# Patient Record
Sex: Female | Born: 2000 | Race: Black or African American | Hispanic: No | Marital: Single | Smoking: Never smoker
Health system: Southern US, Community
[De-identification: ages and names within clinical notes are randomized; demographics above are authoritative.]

---
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
## 2020-03-18 ENCOUNTER — Other Ambulatory Visit: Payer: Self-pay

## 2020-03-18 MED ORDER — METRONIDAZOLE 500 MG PO TABS: 500.0000 mg | ORAL_TABLET | Freq: Two times a day (BID) | ORAL | 0 refills | Status: DC

## 2020-03-18 NOTE — Progress Notes (Signed)
Pt c/o malodorous vaginal discharge. Flagyl sent per protocol

## 2020-04-25 ENCOUNTER — Other Ambulatory Visit: Payer: Self-pay

## 2020-04-25 ENCOUNTER — Ambulatory Visit: Payer: Medicaid Other

## 2020-04-25 ENCOUNTER — Other Ambulatory Visit (HOSPITAL_COMMUNITY)
Admission: RE | Admit: 2020-04-25 | Discharge: 2020-04-25 | Disposition: A | Discharge status: Home / Self Care | Payer: Medicaid Other | Source: Ambulatory Visit | Attending: Obstetrics | Admitting: Obstetrics

## 2020-04-25 DIAGNOSIS — N898 Other specified noninflammatory disorders of vagina: Secondary | ICD-10-CM

## 2020-04-25 DIAGNOSIS — Z113 Encounter for screening for infections with a predominantly sexual mode of transmission: Secondary | ICD-10-CM | POA: Diagnosis present

## 2020-04-25 NOTE — Progress Notes (Signed)
SUBJECTIVE:  20 y.o. female complains of white vaginal discharge for 1 week. Denies abnormal vaginal bleeding or significant pelvic pain or fever. No UTI symptoms. Denies history of known exposure to STD.   OBJECTIVE:  She appears well, afebrile. Urine dipstick: not done.  ASSESSMENT:  Vaginal Discharge  Vaginal Odor   PLAN:  GC, chlamydia, trichomonas, BVAG, CVAG probe sent to lab. Treatment: To be determined once lab results are reviewed by the provider  ROV prn if symptoms persist or worsen.

## 2020-04-26 LAB — CERVICOVAGINAL ANCILLARY ONLY
Bacterial Vaginitis (gardnerella): POSITIVE — AB
Candida Glabrata: NEGATIVE
Candida Vaginitis: NEGATIVE
Chlamydia: POSITIVE — AB
Comment: NEGATIVE
Comment: NEGATIVE
Comment: NEGATIVE
Comment: NEGATIVE
Comment: NEGATIVE
Comment: NORMAL
Neisseria Gonorrhea: NEGATIVE
Trichomonas: POSITIVE — AB

## 2020-04-26 LAB — HEPATITIS C ANTIBODY: Hep C Virus Ab: <0.1 s/co ratio (ref 0.0–0.9)

## 2020-04-26 LAB — HIV ANTIBODY (ROUTINE TESTING W REFLEX): HIV Screen 4th Generation wRfx: NONREACTIVE

## 2020-04-26 LAB — HEPATITIS B SURFACE ANTIGEN: Hepatitis B Surface Ag: NEGATIVE

## 2020-04-26 LAB — RPR: RPR Ser Ql: NONREACTIVE

## 2020-04-29 ENCOUNTER — Other Ambulatory Visit: Payer: Self-pay | Admitting: Family Medicine

## 2020-04-29 ENCOUNTER — Emergency Department
Admission: EM | Admit: 2020-04-29 | Discharge: 2020-04-29 | Disposition: A | Discharge status: Home / Self Care | Payer: Medicaid Other | Attending: Emergency Medicine | Admitting: Emergency Medicine

## 2020-04-29 ENCOUNTER — Other Ambulatory Visit: Payer: Self-pay

## 2020-04-29 ENCOUNTER — Emergency Department: Payer: Medicaid Other

## 2020-04-29 ENCOUNTER — Telehealth: Payer: Self-pay

## 2020-04-29 ENCOUNTER — Encounter: Payer: Self-pay | Admitting: Emergency Medicine

## 2020-04-29 DIAGNOSIS — S42022A Displaced fracture of shaft of left clavicle, initial encounter for closed fracture: Secondary | ICD-10-CM | POA: Diagnosis not present

## 2020-04-29 DIAGNOSIS — S161XXA Strain of muscle, fascia and tendon at neck level, initial encounter: Secondary | ICD-10-CM | POA: Insufficient documentation

## 2020-04-29 DIAGNOSIS — R52 Pain, unspecified: Secondary | ICD-10-CM

## 2020-04-29 DIAGNOSIS — A749 Chlamydial infection, unspecified: Secondary | ICD-10-CM

## 2020-04-29 DIAGNOSIS — S4992XA Unspecified injury of left shoulder and upper arm, initial encounter: Secondary | ICD-10-CM | POA: Diagnosis present

## 2020-04-29 DIAGNOSIS — A5901 Trichomonal vulvovaginitis: Secondary | ICD-10-CM

## 2020-04-29 DIAGNOSIS — Y92410 Unspecified street and highway as the place of occurrence of the external cause: Secondary | ICD-10-CM | POA: Insufficient documentation

## 2020-04-29 MED ORDER — METRONIDAZOLE 500 MG PO TABS: 500.0000 mg | ORAL_TABLET | Freq: Two times a day (BID) | ORAL | 0 refills | Status: DC

## 2020-04-29 MED ORDER — HYDROCODONE-ACETAMINOPHEN 5-325 MG PO TABS: 1.0000 | ORAL_TABLET | Freq: Four times a day (QID) | ORAL | 0 refills | Status: AC | PRN

## 2020-04-29 MED ORDER — NAPROXEN 500 MG PO TABS: 500.0000 mg | ORAL_TABLET | Freq: Two times a day (BID) | ORAL | 0 refills | Status: DC

## 2020-04-29 MED ORDER — HYDROCODONE-ACETAMINOPHEN 5-325 MG PO TABS
1.0000 | ORAL_TABLET | Freq: Once | ORAL | Status: AC
Start: 2020-04-29 — End: 2020-04-29
  Administered 2020-04-29: 1 via ORAL
  Filled 2020-04-29: qty 1

## 2020-04-29 MED ORDER — AZITHROMYCIN 500 MG PO TABS: 1000.0000 mg | ORAL_TABLET | Freq: Once | ORAL | 1 refills | Status: AC
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
## 2020-04-29 NOTE — Telephone Encounter (Signed)
Pt called and given results. Pt advised to finish all medication and abstain from intercourse for at least 14 days after receiving treatment. Pt states she wants to call back to schedule a TOC. Pt has no further questions at this time
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
## 2020-04-29 NOTE — Telephone Encounter (Signed)
-----   Message from Reva Bores, MD sent at 04/29/2020  3:39 PM EDT ----- + for Chlam, trich--please notify anyone needed. Rx sent in and message sent to patient.

## 2020-05-13 ENCOUNTER — Other Ambulatory Visit: Payer: Self-pay

## 2020-05-13 DIAGNOSIS — N898 Other specified noninflammatory disorders of vagina: Secondary | ICD-10-CM

## 2020-05-13 MED ORDER — FLUCONAZOLE 150 MG PO TABS: 150.0000 mg | ORAL_TABLET | Freq: Once | ORAL | 0 refills | Status: AC

## 2020-05-13 NOTE — Progress Notes (Signed)
Pt c/o inner and outer vaginal itching. Recent ABx use for CT and Trich Diflucan sent to the pharmacy per protocol. Pt will contact the office for an appt if sx's worsens.

## 2021-05-15 ENCOUNTER — Ambulatory Visit: Payer: Medicaid Other

## 2021-05-15 ENCOUNTER — Other Ambulatory Visit (HOSPITAL_COMMUNITY)
Admission: RE | Admit: 2021-05-15 | Discharge: 2021-05-15 | Disposition: A | Discharge status: Home / Self Care | Payer: Medicaid Other | Source: Ambulatory Visit | Attending: Advanced Practice Midwife | Admitting: Advanced Practice Midwife

## 2021-05-15 VITALS — BP 117/83 | HR 80 | Wt 211.0 lb

## 2021-05-15 DIAGNOSIS — Z113 Encounter for screening for infections with a predominantly sexual mode of transmission: Secondary | ICD-10-CM

## 2021-05-15 DIAGNOSIS — N926 Irregular menstruation, unspecified: Secondary | ICD-10-CM | POA: Insufficient documentation

## 2021-05-15 DIAGNOSIS — N898 Other specified noninflammatory disorders of vagina: Secondary | ICD-10-CM

## 2021-05-15 NOTE — Progress Notes (Signed)
SUBJECTIVE:  ?21 y.o. female complains of white, creamy, and foul vaginal discharge and irregular periods for 1 month. ?Denies abnormal vaginal bleeding or significant pelvic pain or fever. No UTI symptoms. Denies history of known exposure to STD. ? ? ?OBJECTIVE:  ?She appears well, afebrile. ?Urine dipstick: not done. ? ?ASSESSMENT:  ?Vaginal Discharge  ?Vaginal Odor ? ? ?PLAN:  ?GC, chlamydia, trichomonas, BVAG, CVAG probe sent to lab. ?Treatment: To be determined once lab results are received ?ROV prn if symptoms persist or worsen.  ?

## 2021-05-19 LAB — CERVICOVAGINAL ANCILLARY ONLY
Bacterial Vaginitis (gardnerella): POSITIVE — AB
Candida Glabrata: NEGATIVE
Candida Vaginitis: NEGATIVE
Chlamydia: NEGATIVE
Comment: NEGATIVE
Comment: NEGATIVE
Comment: NEGATIVE
Comment: NEGATIVE
Comment: NEGATIVE
Comment: NORMAL
Neisseria Gonorrhea: NEGATIVE
Trichomonas: NEGATIVE

## 2021-05-20 ENCOUNTER — Telehealth: Payer: Self-pay | Admitting: *Deleted

## 2021-05-20 MED ORDER — METRONIDAZOLE 500 MG PO TABS: 500.0000 mg | ORAL_TABLET | Freq: Two times a day (BID) | ORAL | 0 refills | Status: DC

## 2021-05-20 NOTE — Telephone Encounter (Signed)
Pt called to get results from her swab. Results given and medication sent in  ?

## 2021-11-11 ENCOUNTER — Ambulatory Visit: Payer: Medicaid Other | Admitting: Obstetrics & Gynecology

## 2022-01-20 ENCOUNTER — Ambulatory Visit (INDEPENDENT_AMBULATORY_CARE_PROVIDER_SITE_OTHER): Payer: Medicaid Other | Admitting: Obstetrics and Gynecology

## 2022-01-20 ENCOUNTER — Encounter: Payer: Self-pay | Admitting: Obstetrics and Gynecology

## 2022-01-20 ENCOUNTER — Other Ambulatory Visit (HOSPITAL_COMMUNITY)
Admission: RE | Admit: 2022-01-20 | Discharge: 2022-01-20 | Disposition: A | Discharge status: Home / Self Care | Payer: Medicaid Other | Source: Ambulatory Visit | Attending: Obstetrics & Gynecology | Admitting: Obstetrics & Gynecology

## 2022-01-20 VITALS — BP 127/79 | HR 69 | Wt 215.0 lb

## 2022-01-20 DIAGNOSIS — Z3046 Encounter for surveillance of implantable subdermal contraceptive: Secondary | ICD-10-CM

## 2022-01-20 DIAGNOSIS — Z01419 Encounter for gynecological examination (general) (routine) without abnormal findings: Secondary | ICD-10-CM | POA: Insufficient documentation

## 2022-01-20 NOTE — Progress Notes (Signed)
   Subjective:    Patient ID: Jordan Fischer, female    DOB: [birth year], 22 y.o.   MRN: [id number]  HPI 22 y.o. presenting for annual exam and Nexplanon removal. Patient reports irregular vaginal bleeding since Nexplanon insertion in 01/2019. She is not currently sexually active and is not interested in continuing contraception. She denies pelvic pain or abnormal discharge.   History reviewed. No pertinent past medical history. Past Surgical History:  Procedure Laterality Date   INNER EAR SURGERY  2008   History reviewed. No pertinent family history. Social History   Tobacco Use   Smoking status: Never   Smokeless tobacco: Never  Substance Use Topics   Alcohol use: Never   Drug use: Never     Review of Systems See pertinent in HPI. All other systems reviewed and non contributory    Objective:   Physical Exam Blood pressure 127/79, pulse 69, weight 215 lb (97.5 kg). GENERAL: Well-developed, well-nourished female in no acute distress.  HEENT: Normocephalic, atraumatic. Sclerae anicteric.  NECK: Supple. Normal thyroid.  LUNGS: Clear to auscultation bilaterally.  HEART: Regular rate and rhythm. BREASTS: Symmetric in size. No palpable masses or lymphadenopathy, skin changes, or nipple drainage. ABDOMEN: Soft, nontender, nondistended. No organomegaly. PELVIC: Normal external female genitalia. Vagina is pink and rugated.  Normal discharge. Normal appearing cervix. Uterus is normal in size. No adnexal mass or tenderness. Chaperone present during the pelvic exam EXTREMITIES: No cyanosis, clubbing, or edema, 2+ distal pulses.       Assessment & Plan:   22 yo here for annual exam and nexplanon removal - pap smear collected with GC/CL - patient is interested in receiving Gardasil vaccine - patient does not want any contraception - Patient will be contacted with abnormal results  Nexplanon Removal Patient given informed consent for removal of her Nexplanon, time out was performed.   Signed copy in the chart.  Appropriate time out taken. Implanon site identified.  Area prepped in usual sterile fashon. One cc of 1% lidocaine was used to anesthetize the area at the distal end of the implant. A small stab incision was made right beside the implant on the distal portion.  The Nexplanon rod was grasped using hemostats and removed without difficulty.  There was less than 3 cc blood loss. There were no complications.  A small amount of antibiotic ointment and steri-strips were applied over the small incision.  A pressure bandage was applied to reduce any bruising.  The patient tolerated the procedure well and was given post procedure instructions.

## 2022-01-20 NOTE — Progress Notes (Signed)
Nexplanon placed 01/26/2019

## 2022-01-23 LAB — CYTOLOGY - PAP
Chlamydia: NEGATIVE
Comment: NEGATIVE
Comment: NORMAL
Diagnosis: UNDETERMINED — AB
Neisseria Gonorrhea: NEGATIVE

## 2022-01-30 ENCOUNTER — Other Ambulatory Visit: Payer: Self-pay | Admitting: *Deleted

## 2022-01-30 ENCOUNTER — Ambulatory Visit (INDEPENDENT_AMBULATORY_CARE_PROVIDER_SITE_OTHER): Payer: Medicaid Other | Admitting: Obstetrics

## 2022-01-30 ENCOUNTER — Encounter: Payer: Self-pay | Admitting: Obstetrics

## 2022-01-30 VITALS — BP 112/75 | HR 66 | Wt 221.0 lb

## 2022-01-30 DIAGNOSIS — Z3009 Encounter for other general counseling and advice on contraception: Secondary | ICD-10-CM

## 2022-01-30 DIAGNOSIS — Z30011 Encounter for initial prescription of contraceptive pills: Secondary | ICD-10-CM

## 2022-01-30 DIAGNOSIS — N939 Abnormal uterine and vaginal bleeding, unspecified: Secondary | ICD-10-CM | POA: Diagnosis not present

## 2022-01-30 MED ORDER — ORTHO-NOVUM 1/35 (28) 1-35 MG-MCG PO TABS: 1.0000 | ORAL_TABLET | Freq: Every day | ORAL | 11 refills | Status: DC

## 2022-01-30 MED ORDER — MEGESTROL ACETATE 40 MG PO TABS: 40.0000 mg | ORAL_TABLET | Freq: Two times a day (BID) | ORAL | 0 refills | Status: DC

## 2022-01-30 NOTE — Progress Notes (Signed)
Pt reports irregular and heavy cycles.  No relief with Nexplanon x 3 yrs nor IUD x 6 months.

## 2022-01-30 NOTE — Progress Notes (Signed)
Patient ID: Jordan Fischer, female   DOB: [birth year], 22 y.o.   MRN: [id number]  HPI Jordan Fischer is a 22 y.o. female.  Complains of irregular vaginal bleeding after Nexplanon removal. HPI  History reviewed. No pertinent past medical history.  Past Surgical History:  Procedure Laterality Date   INNER EAR SURGERY  2008    History reviewed. No pertinent family history.  Social History Social History   Tobacco Use   Smoking status: Never   Smokeless tobacco: Never  Substance Use Topics   Alcohol use: Never   Drug use: Never    No Known Allergies  Current Outpatient Medications  Medication Sig Dispense Refill   megestrol (MEGACE) 40 MG tablet Take 1 tablet (40 mg total) by mouth 2 (two) times daily. 30 tablet 0   montelukast (SINGULAIR) 10 MG tablet Take 1 tablet by mouth at bedtime.     norethindrone-ethinyl estradiol 1/35 (Divide 1/35, 28,) tablet Take 1 tablet by mouth daily. 28 tablet 11   No current facility-administered medications for this visit.    Review of Systems Review of Systems Constitutional: negative for fatigue and weight loss Respiratory: negative for cough and wheezing Cardiovascular: negative for chest pain, fatigue and palpitations Gastrointestinal: negative for abdominal pain and change in bowel habits Genitourinary:positive for irregular vaginal bleeding Integument/breast: negative for nipple discharge Musculoskeletal:negative for myalgias Neurological: negative for gait problems and tremors Behavioral/Psych: negative for abusive relationship, depression Endocrine: negative for temperature intolerance      Blood pressure 112/75, pulse 66, weight 221 lb (100.2 kg).  Physical Exam Physical Exam General:   Alert and no distress  Skin:   no rash or abnormalities  Lungs:   clear to auscultation bilaterally  Heart:   regular rate and rhythm, S1, S2 normal, no murmur, click, rub or gallop  Breasts:   Not examined  Abdomen:  normal findings: no  organomegaly, soft, non-tender and no hernia  Pelvis:  External genitalia: normal general appearance Urinary system: urethral meatus normal and bladder without fullness, nontender Vaginal: normal without tenderness, induration or masses Cervix: normal appearance Adnexa: normal bimanual exam Uterus: anteverted and non-tender, normal size    I have spent a total of 20 minutes of face-to-face time, excluding clinical staff time, reviewing notes and preparing to see patient, ordering tests and/or medications, and counseling the patient.   Data Reviewed Wet Prep  Assessment     1. Abnormal uterine bleeding (AUB) Rx: - US PELVIC COMPLETE WITH TRANSVAGINAL; Future - megestrol (MEGACE) 40 MG tablet; Take 1 tablet (40 mg total) by mouth 2 (two) times daily.  Dispense: 30 tablet; Refill: 0  2. Encounter for other general counseling and advice on contraception - options discussed.  Wants OCP's  3. Encounter for initial prescription of contraceptive pills Rx: - norethindrone-ethinyl estradiol 1/35 (Rhea 1/35, 28,) tablet; Take 1 tablet by mouth daily.  Dispense: 28 tablet; Refill: 11     Plan Follow up in 2 weeks    Orders Placed This Encounter  Procedures   US PELVIC COMPLETE WITH TRANSVAGINAL    Standing Status:   Future    Standing Expiration Date:   01/31/2023    Order Specific Question:   Reason for Exam (SYMPTOM  OR DIAGNOSIS REQUIRED)    Answer:   AUB    Order Specific Question:   Preferred imaging location?    Answer:   [facility]   Meds ordered this encounter  Medications   norethindrone-ethinyl estradiol 1/35 (Media 1/35, 28,) tablet  Sig: Take 1 tablet by mouth daily.    Dispense:  28 tablet    Refill:  11   megestrol (MEGACE) 40 MG tablet    Sig: Take 1 tablet (40 mg total) by mouth 2 (two) times daily.    Dispense:  30 tablet    Refill:  0     Tamyra Fojtik A. Jodi Mourning MD 01/30/2022

## 2022-02-02 ENCOUNTER — Other Ambulatory Visit: Payer: Self-pay

## 2022-02-02 DIAGNOSIS — N939 Abnormal uterine and vaginal bleeding, unspecified: Secondary | ICD-10-CM

## 2022-02-02 DIAGNOSIS — N898 Other specified noninflammatory disorders of vagina: Secondary | ICD-10-CM

## 2022-02-02 MED ORDER — IBUPROFEN 800 MG PO TABS: 800.0000 mg | ORAL_TABLET | Freq: Three times a day (TID) | ORAL | 1 refills | Status: AC | PRN

## 2022-02-02 MED ORDER — METRONIDAZOLE 500 MG PO TABS: 500.0000 mg | ORAL_TABLET | Freq: Two times a day (BID) | ORAL | 0 refills | Status: DC

## 2022-02-02 MED ORDER — FLUCONAZOLE 150 MG PO TABS: 150.0000 mg | ORAL_TABLET | Freq: Once | ORAL | 0 refills | Status: AC

## 2022-02-05 ENCOUNTER — Ambulatory Visit
Admission: RE | Admit: 2022-02-05 | Discharge: 2022-02-05 | Disposition: A | Discharge status: Home / Self Care | Payer: Medicaid Other | Source: Ambulatory Visit | Attending: Obstetrics | Admitting: Obstetrics

## 2022-02-05 DIAGNOSIS — N939 Abnormal uterine and vaginal bleeding, unspecified: Secondary | ICD-10-CM | POA: Diagnosis not present

## 2022-02-17 ENCOUNTER — Ambulatory Visit (INDEPENDENT_AMBULATORY_CARE_PROVIDER_SITE_OTHER): Payer: Medicaid Other | Admitting: Obstetrics

## 2022-02-17 ENCOUNTER — Other Ambulatory Visit (HOSPITAL_COMMUNITY)
Admission: RE | Admit: 2022-02-17 | Discharge: 2022-02-17 | Disposition: A | Discharge status: Home / Self Care | Payer: Medicaid Other | Source: Ambulatory Visit | Attending: Obstetrics | Admitting: Obstetrics

## 2022-02-17 ENCOUNTER — Encounter: Payer: Self-pay | Admitting: Obstetrics

## 2022-02-17 VITALS — BP 114/74 | HR 88 | Wt 217.0 lb

## 2022-02-17 DIAGNOSIS — N898 Other specified noninflammatory disorders of vagina: Secondary | ICD-10-CM

## 2022-02-17 DIAGNOSIS — E669 Obesity, unspecified: Secondary | ICD-10-CM

## 2022-02-17 DIAGNOSIS — R300 Dysuria: Secondary | ICD-10-CM

## 2022-02-17 DIAGNOSIS — N939 Abnormal uterine and vaginal bleeding, unspecified: Secondary | ICD-10-CM | POA: Diagnosis not present

## 2022-02-17 LAB — POCT URINALYSIS DIPSTICK
Bilirubin, UA: NEGATIVE
Blood, UA: NEGATIVE
Glucose, UA: NEGATIVE
Ketones, UA: NEGATIVE
Leukocytes, UA: NEGATIVE
Nitrite, UA: NEGATIVE
Protein, UA: NEGATIVE
Spec Grav, UA: 1.01 (ref 1.010–1.025)
Urobilinogen, UA: 0.2 E.U./dL
pH, UA: 7 (ref 5.0–8.0)

## 2022-02-17 LAB — POCT URINE PREGNANCY: Preg Test, Ur: NEGATIVE

## 2022-02-17 NOTE — Progress Notes (Signed)
Pt presents to discuss recent u/s results. Pt reports dysuria and requests STD testing.

## 2022-02-17 NOTE — Progress Notes (Unsigned)
Patient ID: Jordan Fischer, female   DOB: 13-Nov-2000, 22 y.o.   MRN: [id number]  No chief complaint on file.   HPI Jordan Fischer is a 22 y.o. female.  Presents for follow up AUB.  Megace was Rx, and she was started on OCP's for cycle regulation and contraception. HPI  No past medical history on file.  Past Surgical History:  Procedure Laterality Date   INNER EAR SURGERY  2008    No family history on file.  Social History Social History   Tobacco Use   Smoking status: Never   Smokeless tobacco: Never  Substance Use Topics   Alcohol use: Never   Drug use: Never    No Known Allergies  Current Outpatient Medications  Medication Sig Dispense Refill   ibuprofen (ADVIL) 800 MG tablet Take 1 tablet (800 mg total) by mouth every 8 (eight) hours as needed. 60 tablet 1   megestrol (MEGACE) 40 MG tablet Take 1 tablet (40 mg total) by mouth 2 (two) times daily. 30 tablet 0   metroNIDAZOLE (FLAGYL) 500 MG tablet Take 1 tablet (500 mg total) by mouth 2 (two) times daily. 14 tablet 0   montelukast (SINGULAIR) 10 MG tablet Take 1 tablet by mouth at bedtime.     norethindrone-ethinyl estradiol 1/35 ([place] 1/35, 28,) tablet Take 1 tablet by mouth daily. 28 tablet 11   No current facility-administered medications for this visit.    Review of Systems Review of Systems Constitutional: negative for fatigue and weight loss Respiratory: negative for cough and wheezing Cardiovascular: negative for chest pain, fatigue and palpitations Gastrointestinal: negative for abdominal pain and change in bowel habits Genitourinary:negative Integument/breast: negative for nipple discharge Musculoskeletal:negative for myalgias Neurological: negative for gait problems and tremors Behavioral/Psych: negative for abusive relationship, depression Endocrine: negative for temperature intolerance      There were no vitals taken for this visit.    US PELVIC COMPLETE WITH TRANSVAGINAL (Accession [id number])  (Order [id number]) Imaging Date: 02/05/2022 Department: [facility] Released By: Nicholes Calamity, Josetta Huddle Authorizing: Jordan Bombard, MD   Exam Status  Status  Final [99]   PACS Intelerad Image Link   Show images for US PELVIC COMPLETE WITH TRANSVAGINAL Study Result  Narrative & Impression  CLINICAL DATA:  Abnormal uterine bleeding.   EXAM: TRANSABDOMINAL AND TRANSVAGINAL ULTRASOUND OF PELVIS   TECHNIQUE: Both transabdominal and transvaginal ultrasound examinations of the pelvis were performed. Transabdominal technique was performed for global imaging of the pelvis including uterus, ovaries, adnexal regions, and pelvic cul-de-sac. It was necessary to proceed with endovaginal exam following the transabdominal exam to visualize the endometrium.   COMPARISON:  None Available.   FINDINGS: Uterus   Measurements: 4.7 x 3.3 x 3.8 cm = volume: 30.6 mL. No fibroids or other mass visualized.   Endometrium   Thickness: 8.9 mm.  No focal abnormality visualized.   Right ovary   Measurements: 3.5 x 1.9 x 2.1 cm = volume: 7.3 mL. No evidence of ovarian mass. Numerous small, less than 1cm, follicles, without evidence of dominant follicle or corpus luteum.   Left ovary   Measurements: 3.0 x 1.6 x 1.9 cm = volume: 4.7 mL. No evidence of ovarian mass. Numerous small, less than 1cm, follicles, without evidence of dominant follicle or corpus luteum.   Other findings   No abnormal free fluid.   IMPRESSION: Numerous small bilateral ovarian follicles, without evidence of dominant follicle or corpus luteum. These findings can be seen with polycystic ovary syndrome; recommend  clinical correlation and consider biochemical testing.   Endometrium measures 9 mm. If bleeding remains unresponsive to hormonal or medical therapy, sonohysterogram should be considered for focal lesion work-up. (Ref: Radiological Reasoning: Algorithmic Workup of  Abnormal Vaginal Bleeding with Endovaginal Sonography and Sonohysterography. AJR 2008; 537:S82-70)     Electronically Signed   By: Jordan Fischer M.D.   On: 02/05/2022 16:53     Physical Exam Physical Exam General:   Alert and no distress  Skin:   no rash or abnormalities  Lungs:   clear to auscultation bilaterally  Heart:   regular rate and rhythm, S1, S2 normal, no murmur, click, rub or gallop  Pelvic Exam:  Deferred  I have spent a total of 20 minutes of face-to-face time, excluding clinical staff time, reviewing notes and preparing to see patient, ordering tests and/or medications, and counseling the patient.   Data Reviewed UPT  Assessment     1. Abnormal uterine bleeding (AUB), resolved - responded well to Megace.  Now on OCP's. Rx: - POCT urine pregnancy  2. Dysuria Rx: - POCT Urinalysis Dipstick - Urine Culture  3. Vaginal discharge Rx: - Cervicovaginal ancillary only( [facility])  4. Obesity (BMI 35.0-39.9 without comorbidity) - weight reduction recommended     Plan  Follow up in 3 months   Jordan Bombard, MD 02/17/2022 2:27 PM

## 2022-02-18 LAB — CERVICOVAGINAL ANCILLARY ONLY
Bacterial Vaginitis (gardnerella): POSITIVE — AB
Candida Glabrata: NEGATIVE
Candida Vaginitis: NEGATIVE
Chlamydia: NEGATIVE
Comment: NEGATIVE
Comment: NEGATIVE
Comment: NEGATIVE
Comment: NEGATIVE
Comment: NEGATIVE
Comment: NORMAL
Neisseria Gonorrhea: NEGATIVE
Trichomonas: NEGATIVE

## 2022-02-19 LAB — URINE CULTURE: Organism ID, Bacteria: NO GROWTH

## 2022-03-16 ENCOUNTER — Other Ambulatory Visit: Payer: Self-pay

## 2022-03-16 DIAGNOSIS — N898 Other specified noninflammatory disorders of vagina: Secondary | ICD-10-CM

## 2022-03-16 MED ORDER — METRONIDAZOLE 500 MG PO TABS: 500.0000 mg | ORAL_TABLET | Freq: Two times a day (BID) | ORAL | 0 refills | Status: DC

## 2022-03-16 NOTE — Progress Notes (Signed)
Patient requests rx for malodorous vaginal discharge. Flagyl sent to the pharmacy per protocol.
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
## 2022-03-23 ENCOUNTER — Other Ambulatory Visit: Payer: Self-pay

## 2022-03-23 DIAGNOSIS — N898 Other specified noninflammatory disorders of vagina: Secondary | ICD-10-CM

## 2022-03-23 MED ORDER — METRONIDAZOLE 0.75 % VA GEL: 1.0000 | Freq: Every day | VAGINAL | 1 refills | Status: AC

## 2022-03-23 NOTE — Progress Notes (Signed)
Patient reports unable to tolerate pills. She requests to switch to the gel.

## 2022-04-20 ENCOUNTER — Ambulatory Visit: Payer: Self-pay

## 2022-04-20 NOTE — Telephone Encounter (Signed)
    Chief Complaint: Pt. Having heavy flow with her period. Asking to be transferred to Dr. Osvaldo Angst  office Symptoms: Above, cramping Frequency: 4 days ago Pertinent Negatives: Patient denies  Disposition: [] ED /[] Urgent Care (no appt availability in office) / [] Appointment(In office/virtual)/ []  [place] [facility]/ [] Home Care/ [] Refused Recommended Disposition /[] [place] [facility]/ [x]  Follow-up with PCP Additional Notes: Pt. Given phone number and transferred as requested.  Answer Assessment - Initial Assessment Questions 1. AMOUNT: "Describe the bleeding that you are having."    - SPOTTING: spotting, or pinkish / brownish mucous discharge; does not fill panty liner or pad    - MILD:  less than 1 pad / hour; less than patient's usual menstrual bleeding   - MODERATE: 1-2 pads / hour; 1 menstrual cup every 6 hours; small-medium blood clots (e.g., pea, grape, small coin)   - SEVERE: soaking 2 or more pads/hour for 2 or more hours; 1 menstrual cup every 2 hours; bleeding not contained by pads or continuous red blood from vagina; large blood clots (e.g., golf ball, large coin)      Severe 2. ONSET: "When did the bleeding begin?" "Is it continuing now?"     4 days ago 3. MENSTRUAL PERIOD: "When was the last normal menstrual period?" "How is this different than your period?"     March 4. REGULARITY: "How regular are your periods?"     Yes 5. ABDOMEN PAIN: "Do you have any pain?" "How bad is the pain?"  (e.g., Scale 1-10; mild, moderate, or severe)   - MILD (1-3): doesn't interfere with normal activities, abdomen soft and not tender to touch    - MODERATE (4-7): interferes with normal activities or awakens from sleep, abdomen tender to touch    - SEVERE (8-10): excruciating pain, doubled over, unable to do any normal activities      8 6. PREGNANCY: "Is there any chance you are pregnant?" "When was your last menstrual period?"     No 7. BREASTFEEDING: "Are you  breastfeeding?"     No 8. HORMONE MEDICINES: "Are you taking any hormone medicines, prescription or over-the-counter?" (e.g., birth control pills, estrogen)     No 9. BLOOD THINNER MEDICINES: "Do you take any blood thinners?" (e.g., Coumadin / warfarin, Pradaxa / dabigatran, aspirin)     No 10. CAUSE: "What do you think is causing the bleeding?" (e.g., recent gyn surgery, recent gyn procedure; known bleeding disorder, cervical cancer, polycystic ovarian disease, fibroids)         Unsure 11. HEMODYNAMIC STATUS: "Are you weak or feeling lightheaded?" If Yes, ask: "Can you stand and walk normally?"        Normal 12. OTHER SYMPTOMS: "What other symptoms are you having with the bleeding?" (e.g., passed tissue, vaginal discharge, fever, menstrual-type cramps)       No  Protocols used: Vaginal Bleeding - Abnormal-A-AH

## 2022-04-23 ENCOUNTER — Encounter: Payer: Self-pay | Admitting: Obstetrics

## 2022-04-27 ENCOUNTER — Ambulatory Visit: Payer: Medicaid Other | Admitting: Obstetrics and Gynecology

## 2022-05-11 ENCOUNTER — Ambulatory Visit: Payer: Medicaid Other | Admitting: Obstetrics and Gynecology

## 2022-05-13 ENCOUNTER — Ambulatory Visit: Payer: Medicaid Other | Admitting: Obstetrics and Gynecology

## 2022-06-29 ENCOUNTER — Other Ambulatory Visit: Payer: Self-pay

## 2022-06-29 DIAGNOSIS — N898 Other specified noninflammatory disorders of vagina: Secondary | ICD-10-CM

## 2022-06-29 MED ORDER — METRONIDAZOLE 500 MG PO TABS: 500.0000 mg | ORAL_TABLET | Freq: Two times a day (BID) | ORAL | 0 refills | Status: AC

## 2022-06-29 MED ORDER — FLUCONAZOLE 150 MG PO TABS: 150.0000 mg | ORAL_TABLET | Freq: Once | ORAL | 3 refills | Status: AC

## 2022-06-29 NOTE — Progress Notes (Signed)
Pt with c/o possible BV and yeast  Rx sent per protocol

## 2022-07-01 ENCOUNTER — Other Ambulatory Visit: Payer: Self-pay

## 2022-07-03 ENCOUNTER — Other Ambulatory Visit: Payer: Self-pay

## 2022-07-03 DIAGNOSIS — N898 Other specified noninflammatory disorders of vagina: Secondary | ICD-10-CM

## 2022-07-03 MED ORDER — METRONIDAZOLE 0.75 % VA GEL: 1.0000 | Freq: Every day | VAGINAL | 1 refills | Status: AC

## 2022-07-03 NOTE — Progress Notes (Signed)
Pt not able to keep pills down for BV Metrogel sent.

## 2022-07-14 ENCOUNTER — Ambulatory Visit (INDEPENDENT_AMBULATORY_CARE_PROVIDER_SITE_OTHER): Payer: Medicaid Other

## 2022-07-14 VITALS — BP 111/74 | HR 64

## 2022-07-14 DIAGNOSIS — Z3202 Encounter for pregnancy test, result negative: Secondary | ICD-10-CM | POA: Diagnosis not present

## 2022-07-14 DIAGNOSIS — N912 Amenorrhea, unspecified: Secondary | ICD-10-CM

## 2022-07-14 LAB — POCT URINE PREGNANCY: Preg Test, Ur: NEGATIVE

## 2022-07-14 NOTE — Progress Notes (Signed)
Jordan Fischer here for a UPT. LMP is 04/17/22 Roughly.    UPT in office Negative.

## 2022-08-04 ENCOUNTER — Encounter: Payer: Self-pay | Admitting: Obstetrics & Gynecology

## 2022-08-04 ENCOUNTER — Ambulatory Visit (INDEPENDENT_AMBULATORY_CARE_PROVIDER_SITE_OTHER): Payer: Medicaid Other | Admitting: Obstetrics & Gynecology

## 2022-08-04 VITALS — BP 110/70 | HR 76 | Wt 206.0 lb

## 2022-08-04 DIAGNOSIS — N926 Irregular menstruation, unspecified: Secondary | ICD-10-CM

## 2022-08-04 NOTE — Progress Notes (Signed)
GYNECOLOGY OFFICE VISIT NOTE  History:   Jordan Fischer is a 22 y.o. G0P0000 here today for discussion of irregular periods.  Was on Nexplanon x 3 years, IUD for 2 years but still had irregular periods.  She reports skipping some months, and having heavy periods other months.   She was seen by Dr Clearance Coots on 01/30/22 and prescribed Sprintec; she never took this. Skipped a few months, had a couple of periods, including the one she is on now that started on 07/30/22.  She is interested in conceiving, wants to know if she has PCOS.  There was evidence of findings consistent with PCOS on her ultrasound done 02/05/22. She denies any abnormal vaginal discharge, pelvic pain or other concerns.    History reviewed. No pertinent past medical history.  Past Surgical History:  Procedure Laterality Date   INNER EAR SURGERY  2008    The following portions of the patient's history were reviewed and updated as appropriate: allergies, current medications, past family history, past medical history, past social history, past surgical history and problem list.   Health Maintenance:  Normal pap on 01/20/2022.   Review of Systems:  Pertinent items noted in HPI and remainder of comprehensive ROS otherwise negative.  Physical Exam:  BP 110/70   Pulse 76   Wt 206 lb (93.4 kg)   LMP 07/30/2022 (Approximate)   SpO2 97%   BMI 36.49 kg/m  CONSTITUTIONAL: Well-developed, well-nourished female in no acute distress.  HEENT:  Normocephalic, atraumatic. External right and left ear normal. No scleral icterus.  NECK: Normal range of motion, supple, no masses noted on observation SKIN: No rash noted. Not diaphoretic. No erythema. No pallor. MUSCULOSKELETAL: Normal range of motion. No edema noted. NEUROLOGIC: Alert and oriented to person, place, and time. Normal muscle tone coordination. No cranial nerve deficit noted. PSYCHIATRIC: Normal mood and affect. Normal behavior. Normal judgment and thought content. CARDIOVASCULAR:  Normal heart rate noted RESPIRATORY: Effort and breath sounds normal, no problems with respiration noted ABDOMEN: No masses noted. No other overt distention noted.   PELVIC: Deferred  Labs and Imaging TRANSABDOMINAL AND TRANSVAGINAL ULTRASOUND OF PELVIS CLINICAL DATA:  Abnormal uterine bleeding. TECHNIQUE: Both transabdominal and transvaginal ultrasound examinations of the pelvis were performed. Transabdominal technique was performed for global imaging of the pelvis including uterus, ovaries, adnexal regions, and pelvic cul-de-sac. It was necessary to proceed with endovaginal exam following the transabdominal exam to visualize the endometrium. COMPARISON:  None Available.  FINDINGS: Uterus  Measurements: 4.7 x 3.3 x 3.8 cm = volume: 30.6 mL. No fibroids or other mass visualized.  Endometrium  Thickness: 8.9 mm.  No focal abnormality visualized.  Right ovary  Measurements: 3.5 x 1.9 x 2.1 cm = volume: 7.3 mL. No evidence of ovarian mass. Numerous small, less than 1cm, follicles, without evidence of dominant follicle or corpus luteum.  Left ovary  Measurements: 3.0 x 1.6 x 1.9 cm = volume: 4.7 mL. No evidence of ovarian mass. Numerous small, less than 1cm, follicles, without evidence of dominant follicle or corpus luteum.  Other findings  No abnormal free fluid.  IMPRESSION: Numerous small bilateral ovarian follicles, without evidence of dominant follicle or corpus luteum. These findings can be seen with polycystic ovary syndrome; recommend clinical correlation and consider biochemical testing.  Endometrium measures 9 mm. If bleeding remains unresponsive to hormonal or medical therapy, sonohysterogram should be considered for focal lesion work-up. (Ref: Radiological Reasoning: Algorithmic Workup of Abnormal Vaginal Bleeding with Endovaginal Sonography and Sonohysterography. AJR 2008; [id number])  Electronically Signed   By: Annia Belt M.D.   On: 02/05/2022 16:53     Assessment and Plan:     1. Irregular periods Reviewed ultrasound findings with patient.  Discussed likelihood of PCOS diagnosis.  Counseled patient about management of PCOS, recommended weight loss which helps with restoring ovulatory cycles, decreases glucose intolerance with improvement of metabolic risk, improves fertility/pregnancy rates and helps with overall health.  Even modest weight loss (5 to 10 percent reduction in body weight) in women with PCOS may result in these effects.  PCOS is also treated with Metformin given its association with glucose intolerance and insulin resistance.  Over 50% of PCOS patients on 1500mg  of Metformin daily have been shown to ovulate successfully. Common side effects include GI intolerance, kidney and liver enzyme irregularities, lactic acidosis.  She will need baseline BUN,Cr, LFTs prior to starting therapy; CMET checked today. HgA1C and TSH also checked.  Also discussed that for women with PCOS and BMI ?30 kg/m2, letrozole can be used for ovulation inductio. Patient is hesitant to start medications, wants to try on her own for now.  Patient was advised about LH/ovulation predictor kits,  which can help in knowing when to have frequent intercourse at least every other day around the time of ovulation.  Will have her follow up in about 6 weeks, and consider addition of Metformin +/- Letrozole at that time.  Advised her to start taking multivitamins with folic acid supplement, avoid teratogens. Optimization of other health issues recommended.   - Beta hCG quant (ref lab) - Testosterone,Free and Total - TSH Rfx on Abnormal to Free T4 - Prolactin - Hemoglobin A1c - Anti mullerian hormone - Comprehensive metabolic panel  Routine preventative health maintenance measures emphasized. Please refer to After Visit Summary for other counseling recommendations.   Return in about 6 weeks (around 09/15/2022) for PCOS follow up.    I spent 40 minutes dedicated to the  care of this patient including pre-visit review of records, face to face time with the patient discussing her conditions and treatments and post visit orders.    Jaynie Collins, MD, FACOG Obstetrician & Gynecologist, [facility] for [employer], [facility]

## 2022-09-17 ENCOUNTER — Ambulatory Visit (INDEPENDENT_AMBULATORY_CARE_PROVIDER_SITE_OTHER): Payer: Medicaid Other | Admitting: Obstetrics & Gynecology

## 2022-09-17 ENCOUNTER — Encounter: Payer: Self-pay | Admitting: Obstetrics & Gynecology

## 2022-09-17 VITALS — BP 124/78 | HR 69 | Ht 62.0 in | Wt 200.6 lb

## 2022-09-17 DIAGNOSIS — E282 Polycystic ovarian syndrome: Secondary | ICD-10-CM | POA: Diagnosis not present

## 2022-09-17 MED ORDER — METFORMIN HCL 1000 MG PO TABS
ORAL_TABLET | ORAL | 5 refills | Status: AC
Start: 2022-09-17 — End: ?

## 2022-09-17 NOTE — Patient Instructions (Signed)
Research Letrozole for helping with ovulation in PCOS

## 2022-09-17 NOTE — Progress Notes (Signed)
GYNECOLOGY OFFICE VISIT NOTE  History:   Jordan Fischer is a 22 y.o. G0P0000 here today for PCOS management.  She has irregular menstrual periods, polycystic ovaries.  Normal testosterone but elevated AMH noted.  She denies any abnormal vaginal discharge, bleeding, pelvic pain or other concerns.    Past Medical History:  Diagnosis Date   PCOS (polycystic ovarian syndrome) 09/17/2022   Irregular periods, polycystic ovaries on ultrasound, elevated AMH      Past Surgical History:  Procedure Laterality Date   INNER EAR SURGERY  2008    The following portions of the patient's history were reviewed and updated as appropriate: allergies, current medications, past family history, past medical history, past social history, past surgical history and problem list.   Health Maintenance:  ASCUS pap and negative HRHPV on 01/20/2022.    Review of Systems:  Pertinent items noted in HPI and remainder of comprehensive ROS otherwise negative.  Physical Exam:  BP 124/78   Pulse 69   Ht 5\' 2"  (1.575 m)   Wt 200 lb 9.6 oz (91 kg)   BMI 36.69 kg/m  CONSTITUTIONAL: Well-developed, well-nourished female in no acute distress.  HEENT:  Normocephalic, atraumatic. External right and left ear normal. No scleral icterus.  NECK: Normal range of motion, supple, no masses noted on observation SKIN: No rash noted. Not diaphoretic. No erythema. No pallor. MUSCULOSKELETAL: Normal range of motion. No edema noted. NEUROLOGIC: Alert and oriented to person, place, and time. Normal muscle tone coordination. No cranial nerve deficit noted. PSYCHIATRIC: Normal mood and affect. Normal behavior. Normal judgment and thought content. CARDIOVASCULAR: Normal heart rate noted RESPIRATORY: Effort and breath sounds normal, no problems with respiration noted ABDOMEN: No masses noted. No other overt distention noted.   PELVIC: Deferred     Assessment and Plan:    1. PCOS (polycystic ovarian syndrome) Patient is possibly  interested in conceiving soon, does not want birth control. Counseled patient about management of PCOS, recommended weight loss which helps with restoring ovulatory cycles, decreases glucose intolerance with improvement of metabolic risk, improves fertility/pregnancy rates and helps with overall health.  Even modest weight loss (5 to 10 percent reduction in body weight) in women with PCOS may result in these effects.    PCOS is also treated with Metformin given its association with glucose intolerance and insulin resistance.  Over 50% of PCOS patients on 1500mg  of Metformin daily have been shown to ovulate successfully. Common side effects include GI intolerance, kidney and liver enzyme irregularities, lactic acidosis.  She had normal CMET recently.  She will also be instructed to stop therapy if she anticipates major stresses, such as surgery or IVF, in order to avoid lactic acidosis. She was prescribed Metformin 500 mg po daily x 1 week, then bid x 1 week, then 1000 mg po bid . This will also help given her prediabetes diagnosis, prevent progression to T2DM.  Will continue to follow.    She was told to continue multivitamins with folic acid, avoid teratogens.  The next steps will be to start on letrozole, she said she will do further research about this in the meantime.  - metFORMIN (GLUCOPHAGE) 1000 MG tablet; Take 1/2 tablet by mouth daily for one week. Then increase to one tablet daily for one week.  Then one tablet twice a day.  Dispense: 60 tablet; Refill: 5   Please refer to After Visit Summary for other counseling recommendations.   Return in about 1 month (around 10/17/2022) for Follow up  after initiation of Metformin for PCOS.    I spent 25 minutes dedicated to the care of this patient including pre-visit review of records, face to face time with the patient discussing her conditions and treatments and post visit orders.    Jaynie Collins, MD, FACOG Obstetrician & Gynecologist, [facility] for [employer], [facility]

## 2022-10-15 ENCOUNTER — Ambulatory Visit (INDEPENDENT_AMBULATORY_CARE_PROVIDER_SITE_OTHER): Payer: Medicaid Other | Admitting: Obstetrics & Gynecology

## 2022-10-15 ENCOUNTER — Encounter: Payer: Self-pay | Admitting: Obstetrics & Gynecology

## 2022-10-15 VITALS — BP 110/75 | HR 75 | Wt 199.4 lb

## 2022-10-15 DIAGNOSIS — E282 Polycystic ovarian syndrome: Secondary | ICD-10-CM

## 2022-10-15 MED ORDER — LETROZOLE 2.5 MG PO TABS
2.5000 mg | ORAL_TABLET | Freq: Every day | ORAL | 1 refills | Status: AC
Start: 2022-10-15 — End: ?

## 2022-10-15 NOTE — Patient Instructions (Addendum)
Follow up pregnancy test. If not pregnant:  Letrozole prescribed, 2.5 mg po daily on days 3 to 7 following her periods.  If the cycle is ovulatory but pregnancy has not occurred, the same dose should be used in the next cycle. If ovulation does not occur, the dose should be increased to 5 mg/day cycle days 3 to 7, with a maximal dose of 7.5 mg/day.   Advised to continue ovulation predictor kits and regular intercourse. Also advised to continue Metformin and weight loss efforts, continue prenatal vitamins and avoidance of anything that can be toxic to a pregnancy.

## 2022-10-15 NOTE — Progress Notes (Signed)
   GYNECOLOGY OFFICE VISIT NOTE  History:   Jordan Fischer is a 22 y.o. G0P0000 here today for follow up after initiation of Metformin therapy for PCOS. She is on 1000 mg po bid, tolerating this without issues. Took a recent pregnancy test at home, had two lines but one was a different color. But then others were negative. She is unsure, last period was two weeks ago.  She denies any abnormal vaginal discharge, bleeding, pelvic pain or other concerns.    Past Medical History:  Diagnosis Date   PCOS (polycystic ovarian syndrome) 09/17/2022   Irregular periods, polycystic ovaries on ultrasound, elevated AMH      Past Surgical History:  Procedure Laterality Date   INNER EAR SURGERY  2008    The following portions of the patient's history were reviewed and updated as appropriate: allergies, current medications, past family history, past medical history, past social history, past surgical history and problem list.   Health Maintenance: ASCUS pap and negative HRHPV on 01/20/2022.    Review of Systems:  Pertinent items noted in HPI and remainder of comprehensive ROS otherwise negative.  Physical Exam:  BP 110/75   Pulse 75   Wt 199 lb 6.4 oz (90.4 kg)   BMI 36.47 kg/m  CONSTITUTIONAL: Well-developed, well-nourished female in no acute distress.  HEENT:  Normocephalic, atraumatic. External right and left ear normal. No scleral icterus.  NECK: Normal range of motion, supple, no masses noted on observation SKIN: No rash noted. Not diaphoretic. No erythema. No pallor. MUSCULOSKELETAL: Normal range of motion. No edema noted. NEUROLOGIC: Alert and oriented to person, place, and time. Normal muscle tone coordination. No cranial nerve deficit noted. PSYCHIATRIC: Normal mood and affect. Normal behavior. Normal judgment and thought content. CARDIOVASCULAR: Normal heart rate noted RESPIRATORY: Effort and breath sounds normal, no problems with respiration noted ABDOMEN: No masses noted. No other overt  distention noted.   PELVIC: Deferred    Assessment and Plan:    1. PCOS (polycystic ovarian syndrome) Tolerating Metformin well, will check CMET today for surveillance of kidney and liver function. HCG checked today too, given report of unsure lines on OTC pregnancy test.  If not pregnant, Letrozole prescribed, 2.5 mg po daily on days 3 to 7 following her periods.  If the cycle is ovulatory but pregnancy has not occurred, the same dose should be used in the next cycle. If ovulation does not occur, the dose should be increased to 5 mg/day cycle days 3 to 7, with a maximal dose of 7.5 mg/day.   Advised to continue ovulation predictor kits and regular intercourse. Also advised to continue Metformin (pending normal CMET) and weight loss efforts, continue prenatal vitamins and avoidance of anything that can be toxic to a pregnancy.  - Comprehensive metabolic panel - Beta hCG quant (ref lab) - letrozole (FEMARA) 2.5 MG tablet; Take 1 tablet (2.5 mg total) by mouth daily. Take on days 3 to 7 after your period starts  Dispense: 5 tablet; Refill: 1  Routine preventative health maintenance measures emphasized. Please refer to After Visit Summary for other counseling recommendations.   Return for follow up as recommended.    I spent 25 minutes dedicated to the care of this patient including pre-visit review of records, face to face time with the patient discussing her conditions and treatments and post visit orders.    Jaynie Collins, MD, FACOG Obstetrician & Gynecologist, [facility] for [employer], [facility]

## 2022-10-16 LAB — COMPREHENSIVE METABOLIC PANEL
ALT: 24 [IU]/L (ref 0–32)
AST: 26 [IU]/L (ref 0–40)
Albumin: 4.3 g/dL (ref 4.0–5.0)
Alkaline Phosphatase: 50 [IU]/L (ref 44–121)
BUN/Creatinine Ratio: 22 (ref 9–23)
BUN: 14 mg/dL (ref 6–20)
Bilirubin Total: 0.8 mg/dL (ref 0.0–1.2)
CO2: 22 mmol/L (ref 20–29)
Calcium: 9 mg/dL (ref 8.7–10.2)
Chloride: 100 mmol/L (ref 96–106)
Creatinine, Ser: 0.65 mg/dL (ref 0.57–1.00)
Globulin, Total: 2.8 g/dL (ref 1.5–4.5)
Glucose: 86 mg/dL (ref 70–99)
Potassium: 4.4 mmol/L (ref 3.5–5.2)
Sodium: 138 mmol/L (ref 134–144)
Total Protein: 7.1 g/dL (ref 6.0–8.5)
eGFR: 128 mL/min/{1.73_m2} (ref >59)

## 2022-10-16 LAB — BETA HCG QUANT (REF LAB): hCG Quant: <1 m[IU]/mL
